# Patient Record
Sex: Female | Born: 1950 | Race: White | Hispanic: No | Marital: Married
Health system: Southern US, Community
[De-identification: ages and names within clinical notes are randomized; demographics above are authoritative.]

---

## 2007-04-08 ENCOUNTER — Ambulatory Visit: Payer: Self-pay | Admitting: Emergency Medicine

## 2007-07-19 ENCOUNTER — Ambulatory Visit: Payer: Self-pay | Admitting: Internal Medicine

## 2008-06-25 ENCOUNTER — Ambulatory Visit: Payer: Self-pay | Admitting: Internal Medicine

## 2009-12-23 ENCOUNTER — Ambulatory Visit: Payer: Self-pay | Admitting: Internal Medicine

## 2013-04-11 ENCOUNTER — Ambulatory Visit: Payer: Self-pay | Admitting: Emergency Medicine

## 2013-04-11 ENCOUNTER — Inpatient Hospital Stay: Payer: Self-pay | Admitting: Internal Medicine

## 2013-04-11 LAB — COMPREHENSIVE METABOLIC PANEL
Albumin: 4.2 g/dL (ref 3.4–5.0)
Alkaline Phosphatase: 106 U/L (ref 50–136)
Anion Gap: 15 (ref 7–16)
Calcium, Total: 9.2 mg/dL (ref 8.5–10.1)
Co2: 24 mmol/L (ref 21–32)
Creatinine: 1.54 mg/dL — ABNORMAL HIGH (ref 0.60–1.30)
Osmolality: 244 (ref 275–301)
Potassium: 3.1 mmol/L — ABNORMAL LOW (ref 3.5–5.1)
Sodium: 122 mmol/L — ABNORMAL LOW (ref 136–145)
Total Protein: 7.9 g/dL (ref 6.4–8.2)

## 2013-04-11 LAB — PHOSPHORUS: Phosphorus: 3.8 mg/dL (ref 2.5–4.9)

## 2013-04-11 LAB — CBC
HCT: 41.1 % (ref 35.0–47.0)
MCH: 31.3 pg (ref 26.0–34.0)
MCHC: 34.6 g/dL (ref 32.0–36.0)
Platelet: 380 10*3/uL (ref 150–440)
RDW: 13.8 % (ref 11.5–14.5)
WBC: 16.2 10*3/uL — ABNORMAL HIGH (ref 3.6–11.0)

## 2013-04-11 LAB — CK TOTAL AND CKMB (NOT AT ARMC): CK, Total: 191 U/L (ref 21–215)

## 2013-04-11 LAB — URINALYSIS, COMPLETE
Glucose,UR: NEGATIVE mg/dL (ref 0–75)
Ph: 6 (ref 4.5–8.0)
RBC,UR: 1 /HPF (ref 0–5)

## 2013-04-11 LAB — MAGNESIUM: Magnesium: 1.4 mg/dL — ABNORMAL LOW

## 2013-04-11 LAB — ETHANOL
Ethanol %: <0.003 % (ref 0.000–0.080)
Ethanol: <3 mg/dL

## 2013-04-11 LAB — TROPONIN I: Troponin-I: <0.02 ng/mL

## 2013-04-12 LAB — COMPREHENSIVE METABOLIC PANEL
Albumin: 3.7 g/dL (ref 3.4–5.0)
Alkaline Phosphatase: 94 U/L (ref 50–136)
Anion Gap: 8 (ref 7–16)
BUN: 13 mg/dL (ref 7–18)
Bilirubin,Total: 0.4 mg/dL (ref 0.2–1.0)
Chloride: 89 mmol/L — ABNORMAL LOW (ref 98–107)
Creatinine: 1.39 mg/dL — ABNORMAL HIGH (ref 0.60–1.30)
Glucose: 112 mg/dL — ABNORMAL HIGH (ref 65–99)
Osmolality: 251 (ref 275–301)
SGOT(AST): 32 U/L (ref 15–37)
SGPT (ALT): 22 U/L (ref 12–78)
Total Protein: 7 g/dL (ref 6.4–8.2)

## 2013-04-12 LAB — CBC WITH DIFFERENTIAL/PLATELET
Basophil #: 0.1 10*3/uL (ref 0.0–0.1)
Basophil %: 0.7 %
Eosinophil #: 0.2 10*3/uL (ref 0.0–0.7)
HCT: 36.6 % (ref 35.0–47.0)
Lymphocyte %: 18.8 %
MCHC: 34.5 g/dL (ref 32.0–36.0)
MCV: 91 fL (ref 80–100)
Monocyte #: 1.2 x10 3/mm — ABNORMAL HIGH (ref 0.2–0.9)
Monocyte %: 11.2 %
Neutrophil %: 67.7 %
Platelet: 329 10*3/uL (ref 150–440)
RDW: 13.9 % (ref 11.5–14.5)

## 2013-04-12 LAB — LIPID PANEL
HDL Cholesterol: 80 mg/dL — ABNORMAL HIGH (ref 40–60)
VLDL Cholesterol, Calc: 29 mg/dL (ref 5–40)

## 2013-04-12 LAB — HEMOGLOBIN A1C: Hemoglobin A1C: 5.2 % (ref 4.2–6.3)

## 2013-04-13 LAB — BASIC METABOLIC PANEL
Anion Gap: 8 (ref 7–16)
BUN: 7 mg/dL (ref 7–18)
Calcium, Total: 9 mg/dL (ref 8.5–10.1)
Co2: 25 mmol/L (ref 21–32)
Glucose: 97 mg/dL (ref 65–99)
Osmolality: 257 (ref 275–301)
Potassium: 3.2 mmol/L — ABNORMAL LOW (ref 3.5–5.1)
Sodium: 129 mmol/L — ABNORMAL LOW (ref 136–145)

## 2013-04-14 LAB — BASIC METABOLIC PANEL
BUN: 6 mg/dL — ABNORMAL LOW (ref 7–18)
Chloride: 101 mmol/L (ref 98–107)
Co2: 25 mmol/L (ref 21–32)
Potassium: 3.7 mmol/L (ref 3.5–5.1)
Sodium: 133 mmol/L — ABNORMAL LOW (ref 136–145)

## 2013-10-30 ENCOUNTER — Emergency Department: Payer: Self-pay | Admitting: Emergency Medicine

## 2014-01-18 ENCOUNTER — Ambulatory Visit: Payer: Self-pay | Admitting: Neurology

## 2014-01-18 ENCOUNTER — Inpatient Hospital Stay: Payer: Self-pay | Admitting: Internal Medicine

## 2014-01-18 LAB — DRUG SCREEN, URINE
Amphetamines, Ur Screen: NEGATIVE (ref ?–1000)
Barbiturates, Ur Screen: NEGATIVE (ref ?–200)
Benzodiazepine, Ur Scrn: POSITIVE (ref ?–200)
Cannabinoid 50 Ng, Ur ~~LOC~~: NEGATIVE (ref ?–50)
Cocaine Metabolite,Ur ~~LOC~~: NEGATIVE (ref ?–300)
MDMA (ECSTASY) UR SCREEN: NEGATIVE (ref ?–500)
METHADONE, UR SCREEN: NEGATIVE (ref ?–300)
Opiate, Ur Screen: NEGATIVE (ref ?–300)
Phencyclidine (PCP) Ur S: NEGATIVE (ref ?–25)
Tricyclic, Ur Screen: NEGATIVE (ref ?–1000)

## 2014-01-18 LAB — BASIC METABOLIC PANEL
Anion Gap: 8 (ref 7–16)
BUN: 12 mg/dL (ref 7–18)
CALCIUM: 8.8 mg/dL (ref 8.5–10.1)
CHLORIDE: 93 mmol/L — AB (ref 98–107)
Co2: 26 mmol/L (ref 21–32)
Creatinine: 1.04 mg/dL (ref 0.60–1.30)
EGFR (African American): >60
EGFR (Non-African Amer.): 58 — ABNORMAL LOW
GLUCOSE: 66 mg/dL (ref 65–99)
OSMOLALITY: 253 (ref 275–301)
Potassium: 3.4 mmol/L — ABNORMAL LOW (ref 3.5–5.1)
Sodium: 127 mmol/L — ABNORMAL LOW (ref 136–145)

## 2014-01-18 LAB — COMPREHENSIVE METABOLIC PANEL
ALK PHOS: 94 U/L
AST: 33 U/L (ref 15–37)
Albumin: 4 g/dL (ref 3.4–5.0)
Anion Gap: 11 (ref 7–16)
BUN: 18 mg/dL (ref 7–18)
Bilirubin,Total: 0.4 mg/dL (ref 0.2–1.0)
CHLORIDE: 86 mmol/L — AB (ref 98–107)
Calcium, Total: 9.6 mg/dL (ref 8.5–10.1)
Co2: 24 mmol/L (ref 21–32)
Creatinine: 1.13 mg/dL (ref 0.60–1.30)
EGFR (African American): >60
GFR CALC NON AF AMER: 52 — AB
GLUCOSE: 123 mg/dL — AB (ref 65–99)
Osmolality: 247 (ref 275–301)
Potassium: 3.6 mmol/L (ref 3.5–5.1)
SGPT (ALT): 26 U/L (ref 12–78)
Sodium: 121 mmol/L — ABNORMAL LOW (ref 136–145)
TOTAL PROTEIN: 7.8 g/dL (ref 6.4–8.2)

## 2014-01-18 LAB — TSH: Thyroid Stimulating Horm: 2.59 u[IU]/mL

## 2014-01-18 LAB — ACETAMINOPHEN LEVEL: Acetaminophen: <2 ug/mL

## 2014-01-18 LAB — CBC
HCT: 39.9 % (ref 35.0–47.0)
HGB: 13.6 g/dL (ref 12.0–16.0)
MCH: 30.8 pg (ref 26.0–34.0)
MCHC: 34.2 g/dL (ref 32.0–36.0)
MCV: 90 fL (ref 80–100)
Platelet: 326 10*3/uL (ref 150–440)
RBC: 4.43 10*6/uL (ref 3.80–5.20)
RDW: 12.6 % (ref 11.5–14.5)
WBC: 9.1 10*3/uL (ref 3.6–11.0)

## 2014-01-18 LAB — URINALYSIS, COMPLETE
BACTERIA: NONE SEEN
BILIRUBIN, UR: NEGATIVE
Blood: NEGATIVE
Glucose,UR: NEGATIVE mg/dL (ref 0–75)
Hyaline Cast: 3
LEUKOCYTE ESTERASE: NEGATIVE
Nitrite: NEGATIVE
PH: 7 (ref 4.5–8.0)
Protein: 100
SPECIFIC GRAVITY: 1.012 (ref 1.003–1.030)
Squamous Epithelial: NONE SEEN
WBC UR: <1 /HPF (ref 0–5)

## 2014-01-18 LAB — ETHANOL
Ethanol %: <0.003 % (ref 0.000–0.080)
Ethanol: <3 mg/dL

## 2014-01-18 LAB — SALICYLATE LEVEL: Salicylates, Serum: 2.8 mg/dL

## 2014-01-18 LAB — SODIUM
SODIUM: 124 mmol/L — AB (ref 136–145)
Sodium: 124 mmol/L — ABNORMAL LOW (ref 136–145)
Sodium: 128 mmol/L — ABNORMAL LOW (ref 136–145)

## 2014-01-18 LAB — URIC ACID: Uric Acid: 6.6 mg/dL — ABNORMAL HIGH (ref 2.6–6.0)

## 2014-01-18 LAB — OSMOLALITY: Osmolality: 258 mOsm/kg — ABNORMAL LOW (ref 280–301)

## 2014-01-19 LAB — CBC WITH DIFFERENTIAL/PLATELET
BASOS PCT: 0.3 %
Basophil #: 0 10*3/uL (ref 0.0–0.1)
EOS ABS: 0.4 10*3/uL (ref 0.0–0.7)
Eosinophil %: 5.4 %
HCT: 38.6 % (ref 35.0–47.0)
HGB: 12.6 g/dL (ref 12.0–16.0)
LYMPHS ABS: 2 10*3/uL (ref 1.0–3.6)
Lymphocyte %: 29.6 %
MCH: 29.8 pg (ref 26.0–34.0)
MCHC: 32.7 g/dL (ref 32.0–36.0)
MCV: 91 fL (ref 80–100)
Monocyte #: 0.9 x10 3/mm (ref 0.2–0.9)
Monocyte %: 12.9 %
NEUTROS PCT: 51.8 %
Neutrophil #: 3.5 10*3/uL (ref 1.4–6.5)
Platelet: 301 10*3/uL (ref 150–440)
RBC: 4.24 10*6/uL (ref 3.80–5.20)
RDW: 13.1 % (ref 11.5–14.5)
WBC: 6.9 10*3/uL (ref 3.6–11.0)

## 2014-01-19 LAB — BASIC METABOLIC PANEL
ANION GAP: 12 (ref 7–16)
BUN: 11 mg/dL (ref 7–18)
Calcium, Total: 8.9 mg/dL (ref 8.5–10.1)
Chloride: 94 mmol/L — ABNORMAL LOW (ref 98–107)
Co2: 22 mmol/L (ref 21–32)
Creatinine: 0.89 mg/dL (ref 0.60–1.30)
EGFR (Non-African Amer.): >60
GLUCOSE: 67 mg/dL (ref 65–99)
OSMOLALITY: 255 (ref 275–301)
Potassium: 3.1 mmol/L — ABNORMAL LOW (ref 3.5–5.1)
Sodium: 128 mmol/L — ABNORMAL LOW (ref 136–145)

## 2014-01-19 LAB — LIPID PANEL
Cholesterol: 140 mg/dL (ref 0–200)
HDL Cholesterol: 91 mg/dL — ABNORMAL HIGH (ref 40–60)
LDL CHOLESTEROL, CALC: 30 mg/dL (ref 0–100)
Triglycerides: 93 mg/dL (ref 0–200)
VLDL CHOLESTEROL, CALC: 19 mg/dL (ref 5–40)

## 2014-01-19 LAB — SODIUM
Sodium: 128 mmol/L — ABNORMAL LOW (ref 136–145)
Sodium: 128 mmol/L — ABNORMAL LOW (ref 136–145)
Sodium: 131 mmol/L — ABNORMAL LOW (ref 136–145)
Sodium: 133 mmol/L — ABNORMAL LOW (ref 136–145)
Sodium: 132 mmol/L — ABNORMAL LOW (ref 136–145)

## 2014-01-19 LAB — MAGNESIUM: Magnesium: 1.6 mg/dL — ABNORMAL LOW

## 2014-01-19 LAB — TSH: THYROID STIMULATING HORM: 2.64 u[IU]/mL

## 2014-01-20 LAB — SODIUM
Sodium: 133 mmol/L — ABNORMAL LOW (ref 136–145)
Sodium: 133 mmol/L — ABNORMAL LOW (ref 136–145)
Sodium: 135 mmol/L — ABNORMAL LOW (ref 136–145)

## 2014-01-20 LAB — POTASSIUM: POTASSIUM: 4 mmol/L (ref 3.5–5.1)

## 2014-01-20 LAB — MAGNESIUM: Magnesium: 1.9 mg/dL

## 2014-01-21 ENCOUNTER — Observation Stay: Payer: Self-pay | Admitting: Internal Medicine

## 2014-01-21 LAB — CBC WITH DIFFERENTIAL/PLATELET
BASOS PCT: 0.8 %
Basophil #: 0.1 10*3/uL (ref 0.0–0.1)
EOS ABS: 0.3 10*3/uL (ref 0.0–0.7)
EOS PCT: 2.8 %
HCT: 39.2 % (ref 35.0–47.0)
HGB: 12.9 g/dL (ref 12.0–16.0)
Lymphocyte #: 1.6 10*3/uL (ref 1.0–3.6)
Lymphocyte %: 16 %
MCH: 30.4 pg (ref 26.0–34.0)
MCHC: 33 g/dL (ref 32.0–36.0)
MCV: 92 fL (ref 80–100)
MONOS PCT: 9.9 %
Monocyte #: 1 x10 3/mm — ABNORMAL HIGH (ref 0.2–0.9)
NEUTROS ABS: 6.9 10*3/uL — AB (ref 1.4–6.5)
Neutrophil %: 70.5 %
PLATELETS: 335 10*3/uL (ref 150–440)
RBC: 4.24 10*6/uL (ref 3.80–5.20)
RDW: 13 % (ref 11.5–14.5)
WBC: 9.8 10*3/uL (ref 3.6–11.0)

## 2014-01-21 LAB — BASIC METABOLIC PANEL
ANION GAP: 6 — AB (ref 7–16)
BUN: 10 mg/dL (ref 7–18)
CALCIUM: 9.8 mg/dL (ref 8.5–10.1)
CREATININE: 1.1 mg/dL (ref 0.60–1.30)
Chloride: 97 mmol/L — ABNORMAL LOW (ref 98–107)
Co2: 27 mmol/L (ref 21–32)
EGFR (African American): >60
GFR CALC NON AF AMER: 54 — AB
Glucose: 68 mg/dL (ref 65–99)
OSMOLALITY: 258 (ref 275–301)
Potassium: 4.1 mmol/L (ref 3.5–5.1)
Sodium: 130 mmol/L — ABNORMAL LOW (ref 136–145)

## 2014-01-21 LAB — DRUG SCREEN, URINE

## 2014-01-21 LAB — URINALYSIS, COMPLETE
BILIRUBIN, UR: NEGATIVE
Bacteria: NONE SEEN
Glucose,UR: NEGATIVE mg/dL (ref 0–75)
Leukocyte Esterase: NEGATIVE
Nitrite: NEGATIVE
PH: 7 (ref 4.5–8.0)
Specific Gravity: 1.009 (ref 1.003–1.030)
Squamous Epithelial: <1
WBC UR: 1 /HPF (ref 0–5)

## 2014-01-21 LAB — ETHANOL
Ethanol %: <0.003 % (ref 0.000–0.080)
Ethanol: <3 mg/dL

## 2014-01-21 LAB — HEPATIC FUNCTION PANEL A (ARMC)
ALBUMIN: 4.4 g/dL (ref 3.4–5.0)
Alkaline Phosphatase: 95 U/L
Bilirubin,Total: 0.4 mg/dL (ref 0.2–1.0)
SGOT(AST): 72 U/L — ABNORMAL HIGH (ref 15–37)
SGPT (ALT): 35 U/L (ref 12–78)
Total Protein: 7.5 g/dL (ref 6.4–8.2)

## 2014-01-21 LAB — AMMONIA: Ammonia, Plasma: 14 mcmol/L (ref 11–32)

## 2014-01-22 LAB — BASIC METABOLIC PANEL
Anion Gap: 9 (ref 7–16)
BUN: 7 mg/dL (ref 7–18)
CHLORIDE: 102 mmol/L (ref 98–107)
CO2: 23 mmol/L (ref 21–32)
Calcium, Total: 8.6 mg/dL (ref 8.5–10.1)
Creatinine: 0.93 mg/dL (ref 0.60–1.30)
EGFR (African American): >60
EGFR (Non-African Amer.): >60
Glucose: 58 mg/dL — ABNORMAL LOW (ref 65–99)
Osmolality: 264 (ref 275–301)
Potassium: 3.5 mmol/L (ref 3.5–5.1)
SODIUM: 134 mmol/L — AB (ref 136–145)

## 2014-01-22 LAB — CBC WITH DIFFERENTIAL/PLATELET
BASOS ABS: 0.1 10*3/uL (ref 0.0–0.1)
BASOS PCT: 1.4 %
EOS PCT: 6.4 %
Eosinophil #: 0.4 10*3/uL (ref 0.0–0.7)
HCT: 33.9 % — ABNORMAL LOW (ref 35.0–47.0)
HGB: 11.2 g/dL — ABNORMAL LOW (ref 12.0–16.0)
LYMPHS PCT: 36.5 %
Lymphocyte #: 2 10*3/uL (ref 1.0–3.6)
MCH: 30.4 pg (ref 26.0–34.0)
MCHC: 33.2 g/dL (ref 32.0–36.0)
MCV: 92 fL (ref 80–100)
Monocyte #: 0.7 x10 3/mm (ref 0.2–0.9)
Monocyte %: 13.3 %
NEUTROS ABS: 2.3 10*3/uL (ref 1.4–6.5)
Neutrophil %: 42.4 %
PLATELETS: 275 10*3/uL (ref 150–440)
RBC: 3.69 10*6/uL — ABNORMAL LOW (ref 3.80–5.20)
RDW: 13.3 % (ref 11.5–14.5)
WBC: 5.5 10*3/uL (ref 3.6–11.0)

## 2014-01-22 LAB — MAGNESIUM: MAGNESIUM: 1.2 mg/dL — AB

## 2014-08-27 ENCOUNTER — Emergency Department: Payer: Self-pay | Admitting: Student

## 2014-08-27 LAB — COMPREHENSIVE METABOLIC PANEL
ALT: 21 U/L
ANION GAP: 11 (ref 7–16)
Albumin: 2.6 g/dL — ABNORMAL LOW (ref 3.4–5.0)
Alkaline Phosphatase: 120 U/L — ABNORMAL HIGH
BILIRUBIN TOTAL: 0.4 mg/dL (ref 0.2–1.0)
BUN: 44 mg/dL — AB (ref 7–18)
CREATININE: 1.85 mg/dL — AB (ref 0.60–1.30)
Calcium, Total: 8.9 mg/dL (ref 8.5–10.1)
Chloride: 99 mmol/L (ref 98–107)
Co2: 24 mmol/L (ref 21–32)
GFR CALC AF AMER: 35 — AB
GFR CALC NON AF AMER: 29 — AB
GLUCOSE: 66 mg/dL (ref 65–99)
Osmolality: 278 (ref 275–301)
Potassium: 4.6 mmol/L (ref 3.5–5.1)
SGOT(AST): 30 U/L (ref 15–37)
Sodium: 134 mmol/L — ABNORMAL LOW (ref 136–145)
Total Protein: 7.8 g/dL (ref 6.4–8.2)

## 2014-08-27 LAB — PROTIME-INR
INR: 1.2
PROTHROMBIN TIME: 14.9 s — AB (ref 11.5–14.7)

## 2014-08-27 LAB — CBC
HCT: 31.2 % — AB (ref 35.0–47.0)
HGB: 10 g/dL — ABNORMAL LOW (ref 12.0–16.0)
MCH: 29.4 pg (ref 26.0–34.0)
MCHC: 31.9 g/dL — AB (ref 32.0–36.0)
MCV: 92 fL (ref 80–100)
Platelet: 530 10*3/uL — ABNORMAL HIGH (ref 150–440)
RBC: 3.39 10*6/uL — ABNORMAL LOW (ref 3.80–5.20)
RDW: 14.5 % (ref 11.5–14.5)
WBC: 10.7 10*3/uL (ref 3.6–11.0)

## 2014-08-27 LAB — TROPONIN I

## 2014-08-27 LAB — APTT: Activated PTT: 33.1 secs (ref 23.6–35.9)

## 2014-08-27 LAB — PRO B NATRIURETIC PEPTIDE: B-Type Natriuretic Peptide: 9778 pg/mL — ABNORMAL HIGH (ref 0–125)

## 2014-08-28 ENCOUNTER — Ambulatory Visit (HOSPITAL_COMMUNITY)
Admission: AD | Admit: 2014-08-28 | Discharge: 2014-08-28 | Disposition: A | Discharge status: Home / Self Care | Payer: BC Managed Care – PPO | Source: Other Acute Inpatient Hospital | Attending: Student | Admitting: Student

## 2014-08-28 DIAGNOSIS — J449 Chronic obstructive pulmonary disease, unspecified: Secondary | ICD-10-CM | POA: Diagnosis present

## 2014-08-28 DIAGNOSIS — I509 Heart failure, unspecified: Secondary | ICD-10-CM | POA: Insufficient documentation

## 2014-11-12 ENCOUNTER — Inpatient Hospital Stay: Payer: Self-pay | Admitting: Internal Medicine

## 2014-11-17 LAB — PLATELET COUNT: Platelet: 503 10*3/uL — ABNORMAL HIGH (ref 150–440)

## 2014-11-25 DEATH — deceased

## 2014-12-16 NOTE — Discharge Summary (Signed)
PATIENT NAME:  Sherry Lawrence, Sherry Lawrence MR#:  [id number] DATE OF BIRTH:  06/05/1951  DATE OF ADMISSION:  04/11/2013 DATE OF DISCHARGE:  04/14/2013  ADMITTING DIAGNOSIS: Syncope, status post fall.   DISCHARGE DIAGNOSES: 1. Syncope, due to hypotension as well as severe electrolyte abnormalities, status post intravenous fluids and electrolyte replacement.  2.  Hyponatremia, again due to volume depletion and hydrochlorothiazide therapy.  3.  Hypokalemia, status post replacement.  4.  Mild elevated LFT, possibly due to alcohol.  5.  Leukocytosis, felt to be stress reaction.  6.  Right middle lobe calcification likely secondary to previous scarring.  7.  Maxillary sinus fracture as well as nasal fracture, status post evaluation by ENT.  8.  Fracture of the maxilla from dental implant being dislodged and coming loose. She is going to follow up with her oral surgeon, Dr. Landry Mellow, on Thursday of this week.  9.  History of alcohol use in the past.  10.  Acute renal failure due to dehydration, now resolved.   CONSULTANTS: Dr. Carloyn Manner.   PERTINENT LABS AND EVALUATIONS: Admitting glucose 61, BUN 15, creatinine 1.54, sodium 122, potassium 3.1, chloride was 83, CO2 was 24, magnesium was 1.4.   Alcohol level was less than 0.003. LFTs: Total protein 7.9, albumin 4.2, bili total 0.5, alk phos 106. AST is 40, ALT is 25. Troponin less than 0.02. CPK 191, CK-MB 7.8. WBC count 16.2, hemoglobin 14.2, platelet count is 380. Glucose 101, BUN 6, creatinine 0.97, sodium 133, potassium 3.7, chloride 101, CO2 is 25.   Maxillofacial CT shows fracture of the anterior maxilla, soft tissue swelling noted in the level of the chin, with underlying septal foreign body septal fractures, nasal bone, age-undetermined.   CT cervical spine: Postsurgical changes of C2 for odontoid fracture; diffuse degenerative changes.    CT of the head shows no atrophy, no acute intracranial abnormalities.   Chest x-ray of the right rib shows  fourth, and possibly third, rib fractures anterolaterally.   HOSPITAL COURSE: Please refer to H and P done by the admitting physician.   The patient is a 64 year old white female who has been severe alcoholic for many years, who quit drinking about 1-1/2 months ago. She went into DTs. With some detox medication was doing better. The patient was in her normal state of health prior.   She was found on the floor by her son before going to church. The patient was very dizzy and lightheaded. Then after being found on the floor she was feeling okay so they went out to eat at [facility] [employer] [facility], and she got out of the car, took 3 steps, then fainted and passed out. She was seen in the ED and had an impacted tooth. She also had nasal and maxillary sinus fractures. She was found to be severely dehydrated, with multiple electrolyte abnormalities. The patient reported that she was on a grapefruit diet.   Due to her symptoms we were asked to admit the patient, and the patient was aggressively hydrated. Electrolytes all replaced. In terms of her maxillofacial fracture she was seen by  Dr. Pryor Ochoa, who recommended empiric antibiotics for her as well as a followup with her oral surgeon, Dr. Landry Mellow for the fracture of the maxilla from dental implants of the right incisor. The patient was scheduled to be seen by him this Thursday. At this point she is doing much better. Electrolytes are improved. The patient is able to ambulate without assistance, so she is stable for discharge. I recommended her  to continue drinking and eating properly.   DISCHARGE MEDICATIONS: ProAir 1 puff 4 times a day as needed, atorvastatin 40 daily, budesonide/formoterol 2 puffs b.i.d., clonazepam 0.5 mg 1 tablet p.o. at bedtime, Vagifem  10 mcg vaginal tablets 2 times a week, ipratropium nasal 2 sprays to each nostril b.i.d. as needed, albuterol/Atrovent nebulizers 4 times a day, lisinopril 20 daily, venlafaxine 75, 2 capsules daily, Ambien 5 at bedtime  p.r.n., sleep; acetaminophen and oxycodone 325/5 q.4 p.r.n., Augmentin 875 mg 1 tablet p.o. b.i.d. x 5 days.   DIET: Low sodium.   ACTIVITY: As tolerated.   FOLLOWUP: Primary M.D. in 1 to 2 weeks. Follow up with Dr. Landry Mellow, oral maxillofacial surgeon.  Time spent: Thirty-five minutes spent on this discharge.    ____________________________ Lafonda Mosses. Posey Pronto, MD shp:dm D: 04/15/2013 71:25:24 ET T: 04/15/2013 08:36:43 ET JOB#: [id number]  cc: Joycelyn Liska H. Posey Pronto, MD, <Dictator> Alric Seton MD ELECTRONICALLY SIGNED 04/20/2013 18:20
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
## 2014-12-17 NOTE — Discharge Summary (Signed)
PATIENT NAME:  Sherry Lawrence, Sherry Lawrence MR#:  [id number] DATE OF BIRTH:  1950-10-06  DATE OF ADMISSION:  01/18/2014 DATE OF DISCHARGE:  01/20/2014  PRIMARY CARE PHYSICIAN: Nonlocal.  CONSULTING PHYSICIANS: Dr. Loretha BrasilZeylikman, Dr. Cherylann RatelLateef, and Dr. Toni Amendlapacs.  DISCHARGE DIAGNOSES:  1.  Altered mental status and agitation possibly due to seizure secondary to hyponatremia.  2.  Chronic obstructive pulmonary disease.  3.  Alcohol abuse.  4.  Hypertension.  5.  Alcohol withdrawal associated with cognitive changes, Korsakoff'Lawrence dementia.   CONDITION: Stable.   CODE STATUS: FULL code.   HOME MEDICATIONS: Please refer to the medication reconciliation list.   DISCHARGE DIET: Regular diet.   DISCHARGE ACTIVITY: As tolerated.   FOLLOW-UP CARE: Follow up with PCP within 1 to 2 weeks. Follow up with Dr. Toni Amendlapacs within 1 to 2 weeks. The patient needs detox, smoking cessation and alcohol detox.   REASON FOR ADMISSION: Altered mental status and seizure.   HOSPITAL COURSE: The patient is a 64 year old Caucasian female with a history of depression, anxiety, hypertension, COPD, and alcohol dependence. Was sent to the ED due to altered mental status and seizure activity, about 4 minutes. The patient drank a large quantity of beer 2 days prior to this admission. The patient was confused. Only knew her name but not to time and place. The patient was noted to have a low sodium at 121 and admitted for altered mental status, fever, and hyponatremia. For detailed history and physical examination, please refer to the admission note dictated by Dr. Amado CoeGouru. On admission date, the patient'Lawrence sodium was 121, BUN 18, creatinine 1.13, potassium 3.6, and bicarb 24. CBC was normal.  1.  Altered mental status, possibly due to seizure and hyponatremia. After admission the patient has been treated with normal saline IV. Sodium level has been improving. The last sodium is 135 this morning. The patient'Lawrence mental status has much improved. She is  alert, awake, and oriented x3. She has no complaints. No seizure activity or tremor/shaking.  2.  Seizure activity, which is possibly due to hyponatremia. Dr. Loretha BrasilZeylikman evaluated the patient and suggested no EEG, but continue normal saline.  3.  For alcohol abuse, the patient has been treated with CIWA protocol. Dr. 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 The patient needs detox as outpatient.  4.  COPD, stable. 5.  Hypertension, controlled.  The patient has much improved. No complaints. Vital signs are stable. Physical examination is unremarkable. She is clinically stable and will be discharged to home today. I discussed the patient'Lawrence discharge plan with the patient, the [name], nurse, and case Production designer, theatre/television/filmmanager.   TIME SPENT: About 37 minutes.  ____________________________ Shaune PollackQing Emitt Maglione, MD qc:sb D: 01/20/2014 12:13:18 ET T: 01/20/2014 12:34:21 ET JOB#: [id number]  cc: Shaune PollackQing Basel Defalco, MD, <Dictator> Shaune PollackQING Gill Delrossi MD ELECTRONICALLY SIGNED 01/20/2014 15:21
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
## 2014-12-17 NOTE — Consult Note (Signed)
PATIENT NAME:  Sherry Lawrence, Sherry Lawrence MR#:  [id number] DATE OF BIRTH:  Apr 18, 1951  DATE OF CONSULTATION:  01/18/2014  REFERRING PHYSICIAN:  Dr. Ramonita LabAruna Lawrence CONSULTING PHYSICIAN:  Sherry Stracener Lizabeth LeydenN. Branon Sabine, MD  REASON FOR CONSULTATION: Acute hyponatremia in the setting of seizure episode.   HISTORY OF PRESENT ILLNESS: The patient is a 64 year old Caucasian female with past medical history of hypertension, chronic obstructive pulmonary disease, hyperlipidemia, depression, anxiety, and alcohol abuse who presented to [facility] with altered mental status. The patient is unable to offer accurate history at this point in time. The [name] is currently at the bedside and provides most of the history. The [name] reports that he noticed confusion in his wife, which is a new finding yesterday at approximately 4:00 p.m. He reports that she had been abstaining from alcohol for the past seven months; however, recently restarted alcohol. He noticed that she drank at least 2 beers yesterday. Her p.o. intake has been rather poor as well. Later in the afternoon he noticed seizure episode and noticed tonic-clonic extremity activity, as well as foaming at the mouth. Subsequently EMS was called. Once she was brought here she was found to have metabolic derangements. Serum sodium was quite low at 121. Serum sodium 04/14/2013, was 133. Therefore, it appears that she had acute hyponatremia. Serum alcohol level was low upon initial evaluation. The patient has been started on IV fluid hydration with 0.9% normal saline at 30 mL an hour and is also receiving sodium chloride fortified with multivitamin, thiamine, folic acid and magnesium.   PAST MEDICAL HISTORY: 1. Hypertension.  2. Chronic obstructive pulmonary disease.  3. Hyperlipidemia.  4. Depression.  5. Anxiety.  6. Alcohol abuse.  7. Left forearm fracture  8. History of ruptured spleen.  9. Subdural hematoma   ALLERGIES: No known drug  allergies.   CURRENT INPATIENT MEDICATIONS: Include: 1. Sodium chloride fortified with multivitamin, thiamine, folic acid, magnesium to be run at 40 mL/h.  2. Normal saline 0.9% at 30 mL/h.  3. Norco 5/325 mg 1 to 2 tablets p.o. q.4h. p.r.n.  4. Lipitor 40 mg p.o. daily.  5. Klonopin 0.5 mg p.o. at bedtime.  6. Haldol 2 mg IV q.4 hours p.r.n.  7. Heparin 5000 units subcutaneous q.8 hours.  8. Ativan 2 mg IV q.1 hour p.r.n.  9. Nitroglycerin 2% ointment 1 inch topical q.6 hours p.r.n.  10. Zofran 4 mg p.o. every six hours p.r.n.  11. Zantac 150 mg p.o. every 12 hours.  12. Effexor 150 mg p.o. daily.   SOCIAL HISTORY: The patient lives in [place]. She is married. She has a history of tobacco abuse and smokes at least 1 pack of cigarettes per day. She has history of alcohol abuse in the past. She had been abstaining for seven months; however, it appears that recently had alcohol relapse. No illicit drug use reported.   FAMILY HISTORY: Mother deceased secondary to pancreatic cancer. [name] is also deceased; however, the patient unaware of how he died.   REVIEW OF SYSTEMS: Unable to obtain from the patient as she is currently confused.   PHYSICAL EXAMINATION: VITAL SIGNS: Temperature 98.3, pulse 90, respirations 27, blood pressure 130/61, pulse oximetry 95%.  GENERAL: Reveals well-developed, well-nourished Caucasian female who appears her stated age, currently in no acute distress.  HEENT: Normocephalic, atraumatic. Extraocular movements are intact. Pupils equal, round, and reactive to light. No scleral icterus. Conjunctivae are pink. No epistaxis noted. Gross hearing intact. Oral mucosa moist.  NECK: Supple without JVD or lymphadenopathy.  LUNGS: Clear to auscultation bilaterally with normal respiratory effort.  HEART: S1, S2 regular rate and rhythm. No murmurs, rubs, or gallops appreciated.  ABDOMEN: Soft, nontender, nondistended. Bowel sounds positive. No rebound or guarding.  No gross organomegaly appreciated.  EXTREMITIES: No clubbing, cyanosis, or edema.   NEUROLOGIC: The patient is currently awake and alert. Strength is 5/5 in both upper and lower extremities.  SKIN: Warm and dry. No rashes noted.  MUSCULOSKELETAL: No joint redness, swelling or tenderness appreciated.  PSYCHIATRIC: The patient is confused at this time. She is oriented to self and place. Limited insight into her current illness.  GENITOURINARY: No suprapubic tenderness is noted at this time.   LABORATORY DATA: Sodium 121, potassium 3.6, chloride 86, CO2 24, BUN 18, creatinine 1.13, glucose 113, ethanol less than 3, total protein 7.8, albumin 4.0, total bilirubin 0.4, alkaline phosphatase 94, AST 33, ALT 26. Urine drug screen is positive for benzodiazepines and otherwise negative. CBC shows WBC 9.1, hemoglobin 13.6, hematocrit 39, platelets 326, urine protein 100 mg/dL, acetaminophen level less than 2, salicylates 2.8.   IMPRESSION: This is a 64 year old Caucasian female with past medical history of hypertension, chronic obstructive pulmonary disease, hyperlipidemia, depression, anxiety, alcohol dependence, who presented to [facility] with altered mental status and seizure episode.  PROBLEM LIST: 1. Acute hyponatremia.  2. Seizure episode.  3. Altered mental status.  4. Hypertension.  5. Alcohol abuse.   PLAN: The patient presented with altered mental status and seizure episode. Differential diagnosis is extensive but the hyponatremia may be playing a role as well as recent alcohol consumption and the possibility of withdrawal. Serum sodium was noted to be quite low at 121. We agree with the current IV fluid hydration strategy. We would recommend close monitoring of serum sodium every four hours. Goal serum sodium within the next 24 hours would be 130 mEq/L. Agree with holding hydrochlorothiazide at this point in time as this could be potentially contributing to the  hyponatremia. We also agree with watching for delirium tremens closely and using Ativan as needed. We will check a TSH, urine sodium, and uric acid as well. I would like to thank Dr. Amado Coe for this kind referral. Further plan as the patient progresses .  ____________________________ Lennox Pippins, MD mnl:sg D: 01/18/2014 09:38:59 ET T: 01/18/2014 10:20:56 ET JOB#: [id number]  cc: Lennox Pippins, MD, <Dictator> Lennox Pippins MD ELECTRONICALLY SIGNED 01/20/2014 9:43

## 2014-12-17 NOTE — Consult Note (Signed)
Brief Consult Note: Diagnosis: alcohol withdrawl with associated cognitive changes.   Patient was seen by consultant.   Consult note dictated.   Recommend further assessment or treatment.   Comments: Psychiatry: PAtient seen, chart reviewed, consult dictated. From a detox standpoint she seems to be doing well. Not delirious and hemodynamically stable. I think she has a modest degree of Korsakoff's dementia. I'm interested whether husband thinks she is back to baseline. Will follow.  Electronic Signatures: Audery Amellapacs, John T (MD)  (Signed 27-May-15 23:12)  Authored: Brief Consult Note   Last Updated: 27-May-15 23:12 by Audery Amellapacs, John T (MD)

## 2014-12-17 NOTE — Discharge Summary (Signed)
PATIENT NAME:  Sherry Lawrence, Sherry Lawrence MR#:  [id number] DATE OF BIRTH:  12-Jan-1951  DATE OF ADMISSION:  01/21/2014  DATE OF DISCHARGE:  01/23/2014  DISCHARGE DIAGNOSES: 1.  Alcohol-induced delirium.  2.  History of alcohol-induced Korsakoff dementia. 3.  Chronic obstructive pulmonary disease.   4.  Hypertension.  5.  Anxiety and depression.  6.  Alcohol dependency.  7.  History of alcohol-induced seizures.  8.  Leg cellulitis.  DISCHARGE MEDICATIONS: 1.  Klonopin 0.5 mg at bedtime, 1 tablet as needed for anxiety.  2.  Budesonide with formoterol 160/4.5 inhalation 2 puffs b.i.d.  3.  Atorvastatin 40 mg p.o. daily.  4.  DuoNeb 1 vial via nebulizer 4 times daily.  5.  Lisinopril 20 mg p.o. daily.  6.  Venlafaxine 75 mg 2 capsules daily.  7.  Folic acid 1 mg p.o. daily.  8.  Thiamine 100 mg p.o. daily, 3 tablets. 9.  Clindamycin 300 mg every 8 hours for 5 days.   CONSULTATIONS: Psychiatric consult with Dr. Guss Bunde.    HOSPITAL COURSE:  108.  A 64 year old female patient who was discharged on the 28th, came back on the 29th because of confusion. The patient's husband brought her because of confusion, and patient was talking to herself.  The patient was admitted because of altered mental status secondary to possible delirium due to alcohol abuse. The patient really was confused, unable to remember her husband, and she was seen in the Emergency Room and admitted to medicine. The patient was here the day before, and she had Korsakoff dementia, hyponatremia and possible seizure due to hyponatremia. She received IV thiamine, and patient was discharged to home. The patient's lab work was unremarkable this time.  The patient's CT head did not show any acute changes. Chest x-ray was normal. So patient was started on IV thiamine along with multivitamins, continued on CIWA protocol. The patient received IV thiamine 500 mg t.i.d. IV, and she received it for 2 days, on the 29th and 30th. She was more alert, awake,  oriented yesterday. We continued thiamine yesterday and today. Today, she is denying any complaints, and she did not have any withdrawal symptoms, so we are discharging her home with thiamine 100 mg p.o. daily. She was seen by Dr. Loretha Brasil  on the last discharge. The patient's B12 levels were sent, but results are pending, and patient also is on folic acid 1 mg daily because of alcohol-induced Korsakoff dementia. She also was seen by Dr. Guss Bunde this time and Dr. Toni Amend last time. She needs to be on thiamine and folic acid on a regular basis.   2.  COPD. The patient does not have any wheezing. Continue her home medications.   3.  Hypertension, controlled.   4. Cellulitis because of staying on her knees for a long time at home. Started on clindamycin, continue clindamycin. The patient has no fever and cellulitis is improving. Vital signs this morning, temperature 97.9, heart rate 90, blood pressure 176/80, O2 sats 100% on room air. The patient also found to have hypomagnesemia, which is being replaced.   5.  For depression, she is on venlafaxine and also Klonopin.   6.  For hypertension, she is on lisinopril 20 mg p.o. daily.   Will ask social worker to help with referring her to substance abuse and detox programs. Discussed this with husband.   Time spent on discharge preparation: More than 30 minutes.    ____________________________ Katha Hamming, MD sk:mr D: 01/23/2014 08:50:00 ET T: 01/23/2014  16:06:45 ET JOB#: [id number]  cc: Katha HammingSnehalatha Teagen Mcleary, MD, <Dictator> Katha HammingSNEHALATHA Savilla Turbyfill MD ELECTRONICALLY SIGNED 01/24/2014 7:01
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
## 2014-12-17 NOTE — Consult Note (Signed)
PATIENT NAME:  Sherry Lawrence, Sherry Lawrence MR#:  [id number] DATE OF BIRTH:  02-03-1951  DATE OF CONSULTATION:  01/19/2014  REFERRING PHYSICIAN:   CONSULTING PHYSICIAN:  Audery AmelJohn T. Skylen Danielsen, MD  IDENTIFYING INFORMATION AND REASON FOR CONSULTATION:  A 64 year old woman with a history of alcohol dependence who was admitted to the hospital for altered mental status and a recent seizure.  Consult for "detox."   HISTORY OF PRESENT ILLNESS:  Information obtained from the patient and the chart.  She was brought to the hospital on the 26th by her husband who reported that she had had an observed seizure.  This followed after her having a lot of beer to drink on Sunday which came after a period of two week sobriety.  Here in the hospital, she was initially confused with extreme hyponatremia.  She was briefly in the Intensive Care Unit and was described as having bizarre behavior.  The patient herself minimizes her drinking.  She says that a couple days ago she did have two beers.  She says that prior to that she had not had any alcohol since the middle of April when she discontinued her usual habit of consuming large amounts of whiskey on a regular basis.  There is no history of any other recent psychiatric concerns.   PAST PSYCHIATRIC HISTORY:  Long-standing history of alcohol dependence.  She also has had a history of depression although details are lacking.  No known history of suicide attempts or psychiatric hospitalizations.   PAST MEDICAL HISTORY:  The patient has COPD, a history of pain problems and a history of several orthopedic injuries, many of which seem to have been related to drinking.  Most recently, she has a fracture in her left forearm which is still healing.   SOCIAL HISTORY:  Lives with her husband.  She indicates to me feeling contented with her home life.  It sounds like they have an adult son who either lives nearby or with them or visits frequently.   REVIEW OF SYSTEMS:  She is currently denying any  pain.  Denies any depression.  Denies suicidal or homicidal ideation.  Denies any sense of being confused or forgetful.  Full review of systems is negative.   MENTAL STATUS EXAMINATION:  This is a 64 year old woman who was wide awake at 10:30 when I came to see her.  She is wearing makeup in a manner a bit suggestive of someone not completely in control of their faculties.  She is polite and cooperative during the interview.  Eye contact is good.  Psychomotor activity normal.  Speech is normal in tone and rate.  Affect smiling and generally appropriate.  Mood is stated as good.  Denies suicidal or homicidal ideation.  Her thought process is notable for rambling and extreme tangentiality.  A single simple question asked of her generated long stories that flew off into tangents, very far from the central question and required constant redirection.  She was able to remember one out of three objects at five minutes.  She was oriented to the fact that she was in the hospital, although she initially got the name of the hospital wrong.  She was mistaken about the date.  I tried to do a little bit of cognitive testing with her and asked her to write a sentence for me on a piece of paper.  The patient proceeded to write a paragraph that filled the entire sheet of paper and would have continued going if I had not interrupted her.  Judgment and insight a bit impaired.  Fund of knowledge seems to probably be normal.   LABORATORY RESULTS:  Most recently, her sodium is coming back to normal.  It is 132 today.  Potassium and chloride also normalizing.   VITAL SIGNS:  Blood pressure is currently 111/71, pulse 97, temperature 98.3.   MEDICATION:  It appears that the usual alcohol detox protocol was ordered involving as needed medicine.  It looks like she has gotten only a couple of doses of as needed Ativan, not very frequently.   ASSESSMENT:  This is a 64 year old woman with long-standing alcohol dependence.  Recent  seizure, probably is at least in part related to her relapse into alcohol.  The question was about detox.  From the standpoint of alcohol detox she appears to be doing quite well.  She has stable hemodynamic findings and she is not currently delirious.  She is doing well without needing any extra doses of benzodiazepines.  I would not suggest any change to the current protocol from that standpoint.  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  Nevertheless, she appears to be emotionally content.  No sign of depression or psychosis.  I would be interested to know if her current mental state seems like baseline to her husband.   TREATMENT PLAN:  No change to current treatment plan.  Thiamine replenishment has already been done.  Current detox protocol is fine.  The patient is talking about wanting to go to a long-term substance abuse program.  Based on her current mental state I am not sure if such a thing would be helpful, so much as would be getting her into an environment where she cannot get her hands on any alcohol.  Nevertheless, I will continue to follow up.   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   SECONDARY DIAGNOSES: AXIS I:  Alcohol dependence.  AXIS II:  No diagnosis.  AXIS III:  Alcohol withdrawal.  Recent seizure.  AXIS IV:  Moderate from chronic illness.  AXIS V:  Functioning at time of evaluation 40.    ____________________________ Audery Amel, MD jtc:ea D: 01/19/2014 23:24:00 ET T: 01/20/2014 01:40:18 ET JOB#: [id number]  cc: Audery Amel, MD, <Dictator> Audery Amel MD ELECTRONICALLY SIGNED 02/09/2014 13:19
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
## 2014-12-25 NOTE — Consult Note (Signed)
PATIENT NAME:  Sherry Lawrence, Sherry Lawrence MR#:  [id number] DATE OF BIRTH:  Jan 09, 1951  DATE OF CONSULTATION:  11/12/2014  REFERRING PHYSICIAN:   CONSULTING PHYSICIAN:  Scot Jun, MD  HISTORY OF PRESENT ILLNESS: The patient is a 64 year old white female who went to the ER for acute confusion, abdominal pain, and constipation. She was admitted to the hospital and I was asked to see her in consultation.   PAST MEDICAL HISTORY: 1.  Recent C-spine fusion, wearing a collar.  2.  History of seizure disorder in the past, sometimes alcohol withdrawal seizures.  3.  COPD. A pack a day smoker for all her adult life.  4.  Anxiety, depression.  5.  Hypertension.   MEDICATIONS: Venlafaxine 75 mg 3 capsules once a day, Tylenol 325 mg one 3 times a day, thiamine 100 mg a day, ProAir inhaler 4 times a day, oxycodone 5 mg 1 to 2 tabs q. 4 hours, transdermal nicotine patch 14 mg in 24 hours, Relafen 500 mg one 2 times a day, extended morphine 1 q. 12 hours, methocarbamol 500 mg one 3 times a day, lisinopril 20 mg one a day, Atrovent nasal twice a day, hydrochlorothiazide 0.5 tablets once a day, folic acid 1 mg a day, Pepcid 10 mg a day, docusate 2 tablets 2 times a day which she was not taking according to her husband, clonazepam 0.5 mg one a day, atorvastatin 40 mg a day, amlodipine 5 mg a day, Combivent inhaler or DuoNebs.   HABITS: Smoked a pack a day. Has a history of alcohol use and dependence for many years, been off alcohol since 2013. She did quit smoking in late 2015 as well.   FAMILY HISTORY: See old records.   PHYSICAL EXAMINATION: GENERAL: Exam done with husband and nurse in the room. Temperature 98.6, pulse 112, respirations 18, blood pressure 85/44, 96% on 2 liters.  GENERAL: Elderly white female who looks older than stated age.  HEENT: Sclerae nonicteric. Conjunctivae negative. Tongue is negative.  HEAD: Atraumatic. She is wearing a cervical collar.  CHEST: Clear in the anterior fields.  HEART:  Shows a 1/6 systolic murmur.  ABDOMEN: Bowel sounds are present but diminished. No hepatosplenomegaly. No masses. No bruits.  RECTAL: Exam showed no stool in the vault, a little bit of smear was brown.   LABORATORY DATA: Pertinent for a very elevated platelet count, very elevated CPK, somewhat elevated SGOT. Pro time 14.8 with an of INR 1.1. BUN of 70, creatinine 5.6, potassium 6.4. Troponin 0.04 with a very elevated CPK of 5475. EKG showed sinus tach. Flat plate of the abdomen showed mild prominence of small bowel suggesting an ileus. No significant dilatation of the colon from stool. Chest x-ray showed no infiltrates. CAT scan of the head showed some atrophy. Arterial blood gas shows an elevated pCO2 of 49 with a pH 7.29.   The patient is awake and answers questions appropriately but she is somewhat lethargic.   ASSESSMENT AND PLAN:  1.  Probable renal failure from poor oral intake. Recommend encourage oral intake and intravenous rehydration.  2.  Constipation from pain medication. No evidence of any colonic blockage at this time and a negative physical exam. I would recommend stool softeners with senna because of her need for taking pain medication. She was prescribed this, but husband said that she did not like to take it because of diarrhea that might come. He said that she does not drink a lot of water either.  3.  She also  has a history of seizure disorders. She had a serious problem with Keppra that caused her to have very nervous agitation. He did say that the seizure disorder they were told that it was likely alcohol withdrawal seizures in the past. She has been out of rehab since 03/2012.   At this time no specific GI procedures or interventions recommended. I will follow with you.     ____________________________ Scot Junobert T. Elliott, MD rte:at D: 11/12/2014 15:28:25 ET T: 11/12/2014 16:08:10 ET JOB#: [id number]  cc: Scot Junobert T. Elliott, MD, <Dictator> Duane LopeJeffrey D. Judithann SheenSparks, MD Scot JunOBERT T  ELLIOTT MD ELECTRONICALLY SIGNED 12/10/2014 16:00

## 2014-12-25 NOTE — Consult Note (Signed)
Pt was quite delerious yesterday, I forgot to write a note after seeing her.  She is more oriented and knows she is in [facility].  Drinking water.  She complains of a headache and therefore does not want to talk any more.  I was consulted for constipation and abd pain and will sign off, reconsult if needed.  Electronic Signatures: Scot JunElliott, Naiyah Klostermann T (MD)  (Signed on 21-Mar-16 17:36)  Authored  Last Updated: 21-Mar-16 17:36 by Scot JunElliott, Leveda Kendrix T (MD)

## 2014-12-25 NOTE — Discharge Summary (Signed)
PATIENT NAME:  Sherry Lawrence, Sherry Lawrence MR#:  [id number] DATE OF BIRTH:  October 07, 1950  DATE OF ADMISSION:  11/12/2014 DATE OF DISCHARGE:  The patient is medically ready once bed available at rehabilitation.   PRESENTING COMPLAINT: Altered mental status, confusion.   DISCHARGE DIAGNOSES:  1.  Acute encephalopathy/altered mental status suspected due to severe dehydration and rhabdomyolysis, improving.  2.  Acute renal failure, appears prerenal azotemia secondary to poor p.o. intake and dehydration, improved.  3.  Hyperkalemia, resolved.  4.  High anion gap acidosis, mixed respiratory with metabolic acid, resolved.  5.  History of alcohol abuse in the past.  6.  History of smoking in the past.  7.  Generalized weakness with unsteady gait.  8.  Hyperlipidemia.  9.  Hypertension.  10.  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   CONDITION ON DISCHARGE: Fair.   CODE STATUS: Full code.    LABORATORY DATA: Creatinine is 1.44, sodium is 139, potassium 3.3. CK total is 2088. Uric acid is 13.4. Creatinine on admission was 5.52.   CONSULTATION: Nephrology, Dr. Cherylann Ratel.   MEDICATIONS AT DISCHARGE:  1.  Atorvastatin 40 mg daily.  2.  Budesonide/formoterol 160/4.5 two puffs b.i.d.  3.  Clonazepam 0.5 mg 1 tablet daily at bedtime as needed.  4.  Albuterol ipratropium 3 mL by nebulizer 4 times a day as needed.  5.  Lisinopril 20 mg daily.  6.  Tylenol 325 one tablet t.i.d.  7.  ProAir HFA 90 mcg inhalation 1 puff 4 times a day as needed.  8.  Famotidine 20 mg 1 tablet daily.  9.  Folic acid 1 tablet p.o. daily.  10.  Morphine extended release 15 mg b.i.d.  11.  Nabumetone 500 mg 1 tablet b.i.d. as needed for neck pain.  12.  Nicotine patch 14 mg daily.  13.  Docusate senna 1 tablet daily.  14.  Thiamine 100 mg daily.  15.  Multivitamin with minerals p.o. daily.  16.  Spiriva 18 mcg inhalation daily.  17.  Venlafaxine 75 mg 3 capsules p.o. daily.  18.  Tramadol 50 mg t.i.d. as needed.  19.  Mag-Ox 400 mg  b.i.d.  20.  Amlodipine 5 mg p.o. daily at bedtime.   SUMMARY OF HOSPITAL COURSE: Sherry Lawrence is a 64 year old Caucasian female with history of alcohol abuse and smoking along with history of hypertension, hyperlipidemia, comes to the Emergency Room brought in by her husband with altered mental status. She was admitted with:  1.  Altered mental status/acute encephalopathy, which was suspected due to severe dehydration/renal failure upon admission. She came in with creatinine of 5.52; her baseline creatinine is around 1.0. The patient was admitted, started on aggressive IV fluids, received IV bicarbonate drip as well. Her symptoms improved and mentation improved.  2.  Acute renal failure suspected dehydration from poor p.o. intake in the setting of p.o. narcotics, which were given, along with increased use of p.o. narcotics status post recent neck surgery. The patient aggressively received IV fluids. Nephrology consultation was done. The patient also received an IV bicarbonate drip. She was found to have rhabdomyolysis, which improved.  3.  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 The patient will follow up with primary care physician as outpatient. She received aggressive IV hydration.  4.  Acute rhabdomyolysis suspected to the muscle injury, laying around, not being active after neck surgery in the setting of p.o. pain medication intake. The patient received aggressive IV fluids and her CK total came down to 2000.  5.  Hypertension. Resumed home medications, which are amlodipine and lisinopril. Hydrochlorothiazide/triamterene was discontinued.  6.  History of alcohol abuse. The patient was kept on CIWA protocol. Did not seem to be in withdrawal.  7.  History of anxiety. The patient did seem "sometimes off;" however, she did answer most basic questions appropriately.  8.  Generalized weakness, unsteady gait. PT evaluation noted. Recommends rehabilitation. Awaiting, at present, insurance authorization.  The patient is medically stable, near baseline for discharge to rehab.   TIME SPENT: 40 minutes.     ____________________________ Wylie HailSona A. Allena KatzPatel, MD sap:bm D: 11/16/2014 13:05:21 ET T: 11/17/2014 01:48:40 ET JOB#: [id number]  cc: Kaydense Rizo A. Allena KatzPatel, MD, <Dictator> Willow OraSONA A Philmore Lepore MD ELECTRONICALLY SIGNED 11/28/2014 12:23

## 2014-12-25 NOTE — H&P (Signed)
PATIENT NAME:  Sherry Lawrence, Sherry Lawrence MR#:  [id number] DATE OF BIRTH:  September 25, 1950  DATE OF ADMISSION:  11/12/2014  REFERRING PHYSICIAN:  Sheryl L. Mindi Junker, MD.   FAMILY PHYSICIAN:  Nonlocal.    REASON FOR ADMISSION: Altered mental status.   HISTORY OF PRESENT ILLNESS: The patient is a 64 year old female with a significant history of COPD, anxiety/depression, hypertension and hyperlipidemia. Also has a remote history of seizures. Had cervical spine surgery done at [facility] approximately 2 weeks ago. Presents to the Emergency Room with acute confusion associated with abdominal pain and constipation. Has been taking oral narcotics for her postoperative pain. In the Emergency Room, no family is present and the patient is confused, unable to give any history.   PAST MEDICAL HISTORY:  1. Status post recent C-spine fusion.  2. Remote history of seizure disorder.  3. Hyperlipidemia.  4. Benign hypertension.  5. COPD.   6. Anxiety/depression.   MEDICATIONS:  1. Effexor XR 150 mg p.o. daily.  2. Thiamine 100 mg p.o. daily.  3. Lisinopril 20 mg p.o. daily.  4. Folic acid 1 mg p.o. daily.  5. Klonopin 0.25 mg p.o. at bedtime p.r.n.  6. Symbicort 160/4.5, two puffs b.i.d.  7. Lipitor 40 mg p.o. daily.  8. DuoNeb SVNs q.i.d.   ALLERGIES: No known drug allergies.   SOCIAL HISTORY: The patient smokes every day. There is some questionable history of alcohol use, but this is unsure.   FAMILY HISTORY:  Unable to obtain due to the patient's confusion.   REVIEW OF SYSTEMS:  Unable to obtain due to the patient's confusion.   PHYSICAL EXAMINATION: The patient is confused, but in no acute distress.  VITAL SIGNS: Currently remarkable for a blood pressure of 113/58 with a heart rate of 108, respiratory rate of 20, temperature of 98.4, saturation 90% on room air.  HEENT: Normocephalic, atraumatic. Pupils equal, round, reactive to light and accommodation. Extraocular movements are intact. Sclerae are anicteric.  Conjunctivae are clear. Oropharynx is clear.   NECK: Supple, without JVD. No adenopathy or thyromegaly is noted.  LUNGS: Revealed basilar crackles without wheezes or rhonchi. No dullness. Respiratory effort is normal.  CARDIAC: Rapid rate with a regular rhythm. Normal S1 and S2. There is a 2/6 systolic murmur noted throughout the precordium.  No rubs or gallops are present. PMI is nondisplaced. Chest wall is nontender.  ABDOMEN: Distended, diffusely tender, with hypoactive bowel sounds. No obvious organomegaly or ascites noted.  EXTREMITIES: Without clubbing, cyanosis or edema. Pulses were 2+ bilaterally.  SKIN: Warm and dry without rash or lesions.  NEUROLOGIC: Revealed cranial nerves II through XII grossly intact. Deep tendon reflexes were symmetric. Motor and sensory exam is nonfocal.  PSYCHIATRIC: Revealed a patient who was alert, but disoriented to place and time.  She was oriented to person.   LABORATORY DATA: Glucose was 108 with a BUN of 70 and a creatinine of 5.61, with a GFR of 7. Her potassium was 6.4 with a sodium of 131. Her troponin was 0.04. Her white count was 13.5 with a hemoglobin of 9.1 and a platelet count of 702,000. Her urinalysis was unremarkable. Her EKG revealed sinus tachycardia with no acute ischemic changes.   Chest x-ray was unremarkable. KUB revealed mild prominence of the small bowel but no obvious obstruction. Head CT was negative.   ASSESSMENT:  1. Altered mental status due to metabolic encephalopathy.  2. Acute renal failure.  3. Hyperkalemia.  4. Chronic obstructive pulmonary disease.  5. Abdominal pain.  6. Constipation.  PLAN: The patient will be admitted to the floor as a full code. We will consult nephrology and GI. We will obtain an abdominal ultrasound, then proceed with clear liquid diet along with her IV fluids. We will recheck her potassium later tonight after treatment given in the Emergency Room. Minimize narcotics. Optimize her bowel regimen.  Further treatment and evaluation will depend upon the patient's progress.   TOTAL TIME SPENT ON THIS PATIENT: 50 minutes.  ____________________________ Duane LopeJeffrey D. Judithann SheenSparks, MD jds:by D: 11/12/2014 13:31:33 ET T: 11/12/2014 13:39:46 ET JOB#: [id number]  cc: Duane LopeJeffrey D. Judithann SheenSparks, MD, <Dictator> Vinayak Bobier Rodena Medin Jessamy Torosyan MD ELECTRONICALLY SIGNED 11/12/2014 15:00

## 2014-12-25 NOTE — Discharge Summary (Signed)
PATIENT NAME:  Staci RighterMCADAMS, Anamae S MR#:  [id number] DATE OF BIRTH:  1951-01-06  DATE OF ADMISSION:  11/12/2014 DATE OF DISCHARGE:  11/17/2014  ADDENDUM  Ms. Thurman CoyerMcAdams has a bed offer at [facility]. She will be discharged later today. We will try to wean her oxygen to room air. Her sats are 96 to 98% on 1 liter. She will use oxygen as needed, 1 to 2 liters nasal cannula per minute, if her sats drop less than 92%.   ____________________________ Wylie HailSona A. Allena KatzPatel, MD sap:sb D: 11/17/2014 09:13:47 ET T: 11/17/2014 14:09:15 ET JOB#: [id number]  cc: Ovie Eastep A. Allena KatzPatel, MD, <Dictator> Willow OraSONA A Hasan Douse MD ELECTRONICALLY SIGNED 11/28/2014 12:23

## 2015-03-29 LAB — MAGNESIUM: Magnesium: 1.7 mg/dL

## 2015-03-29 LAB — POTASSIUM: Potassium: 3 mmol/L — ABNORMAL LOW

## 2015-05-10 IMAGING — CT CT HEAD WITHOUT CONTRAST
4 series · 18 of 30 positions shown, 19 images · non-contrast
Comparison: None.

CLINICAL DATA: Altered mental status.  Confusion.  Seizure.

EXAM:
CT HEAD WITHOUT CONTRAST
TECHNIQUE: Contiguous axial images were obtained from the base of the skull
through the vertex without intravenous contrast.

[Series 2: head bone · axial · 0.38mm/px · z∈[-162,-32]mm · 8 of 81 slices shown]
[im 8/81  bone]
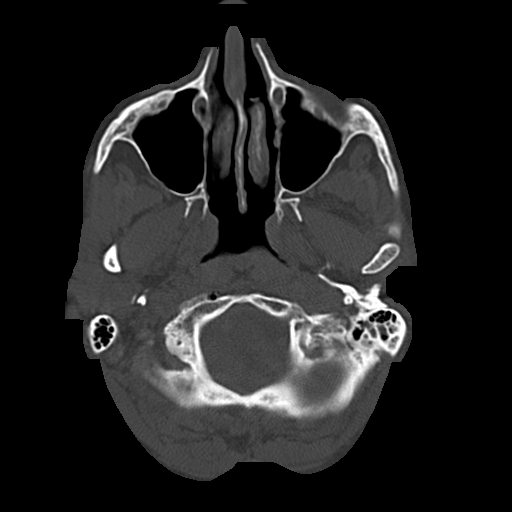
[im 15/81  bone]
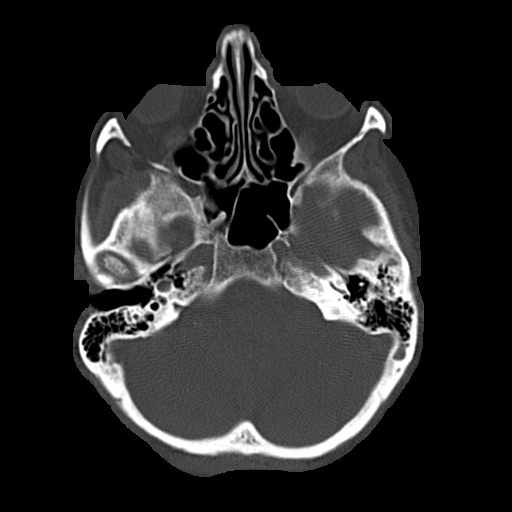
[im 30/81  bone]
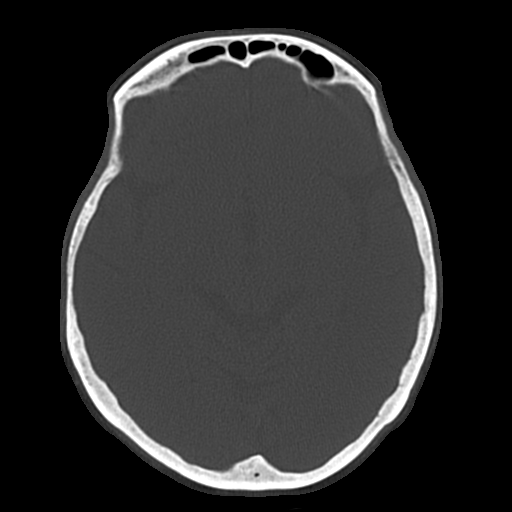
[im 37/81  bone]
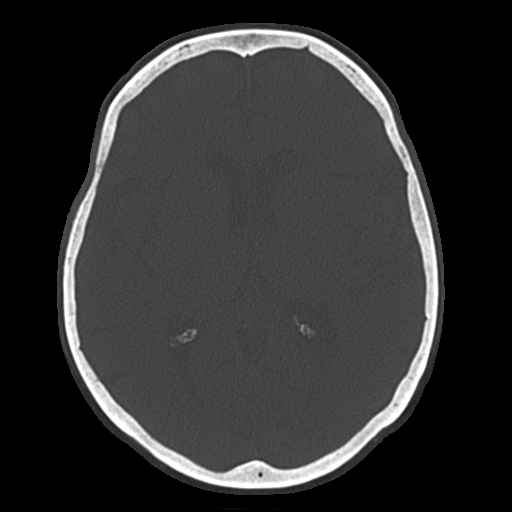
[im 44/81  bone]
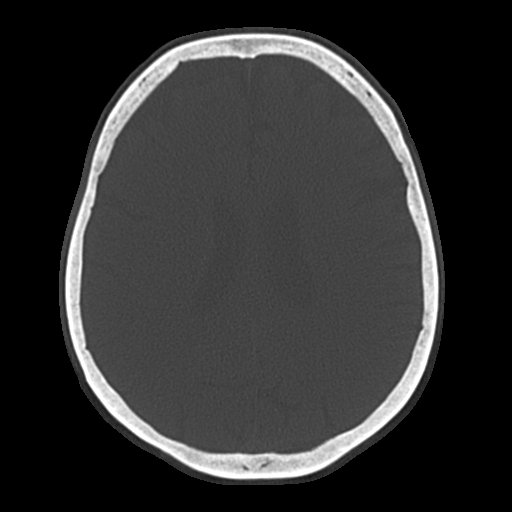
[im 51/81  bone]
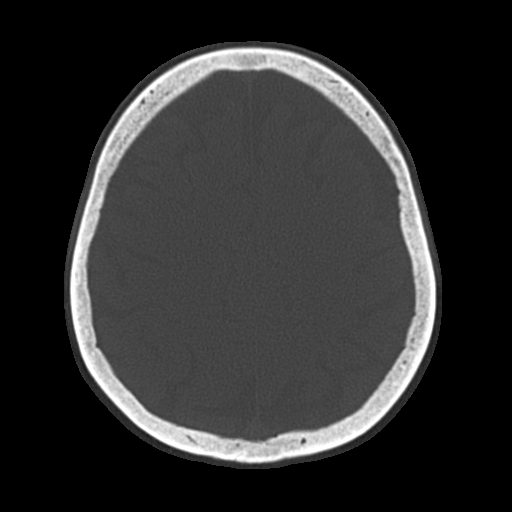
[im 66/81  bone]
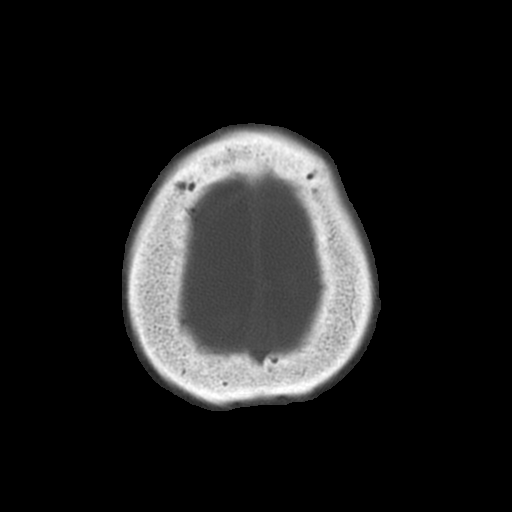
[im 73/81  bone]
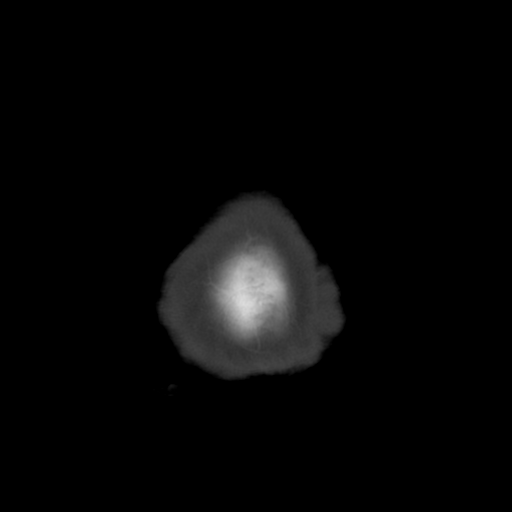

[Series 3: head wo · axial · 0.38mm/px · z∈[-122,-76]mm · 2 of 29 slices shown, 3 images]
[im 10/29  brain]
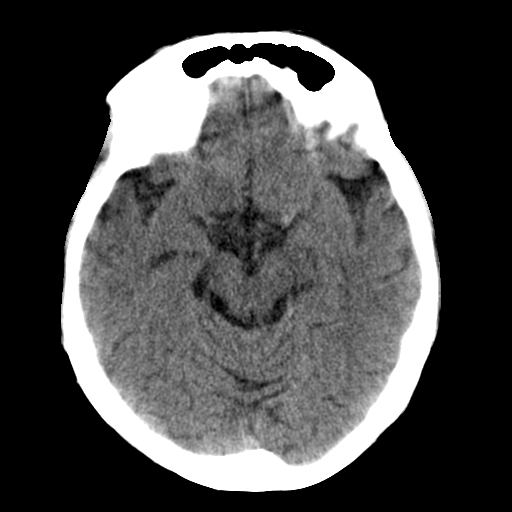
[im 10/29  bone]
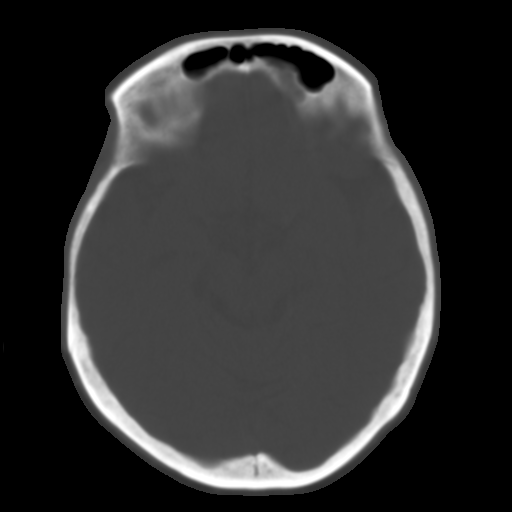
[im 19/29  brain]
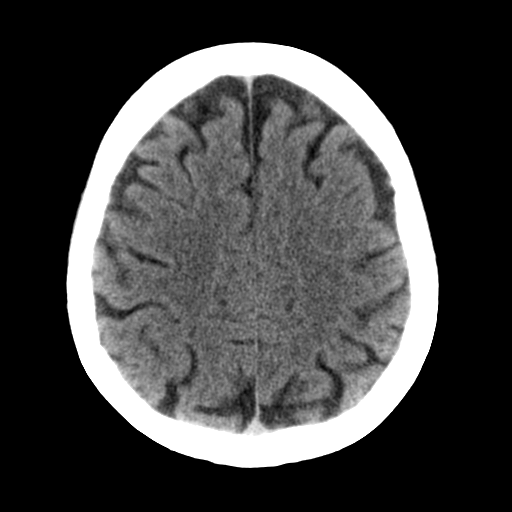

[Series 4: head wo recons · axial · 0.39mm/px · z∈[-71,-30]mm · 2 of 27 slices shown]
[im 9/27  brain]
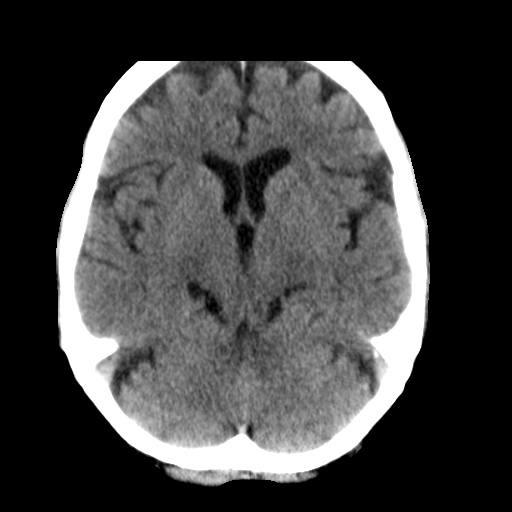
[im 18/27  brain]
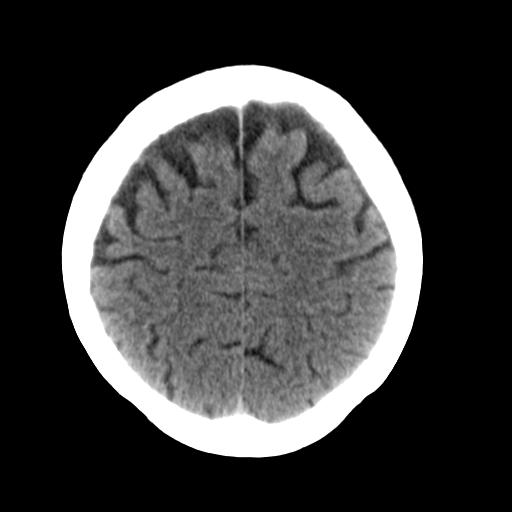

[Series 5: head bone recons · axial · 0.38mm/px · z∈[-101,-29]mm · 6 of 71 slices shown]
[im 8/71  bone]
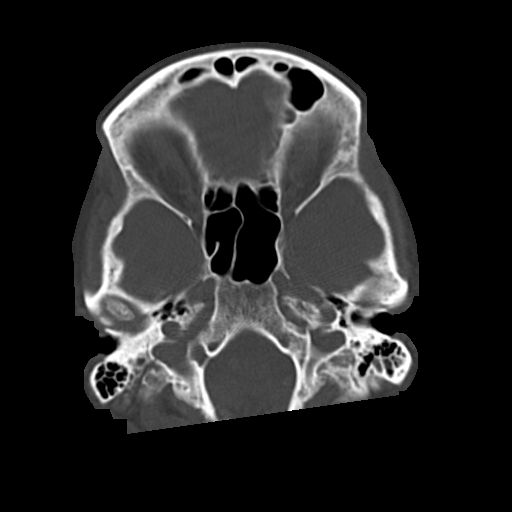
[im 16/71  bone]
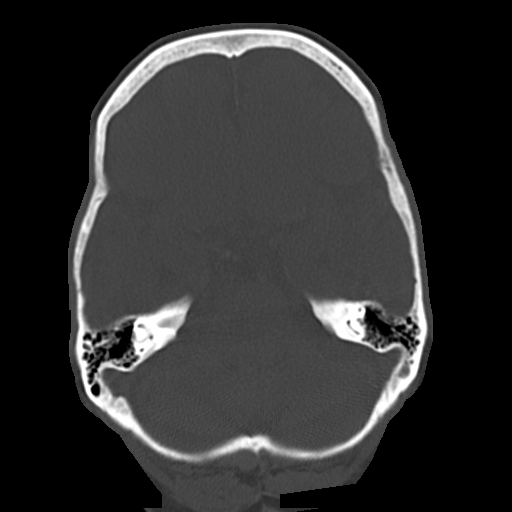
[im 24/71  bone]
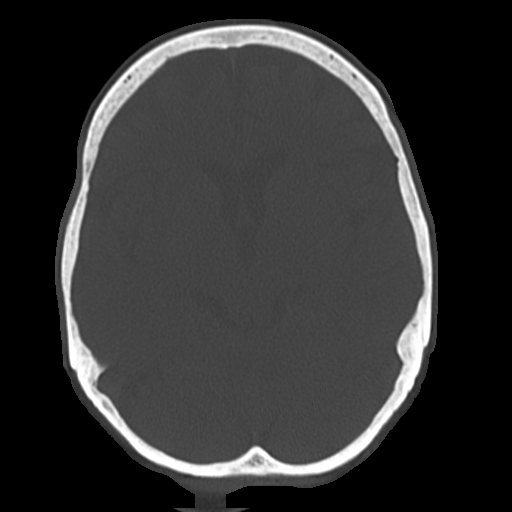
[im 32/71  bone]
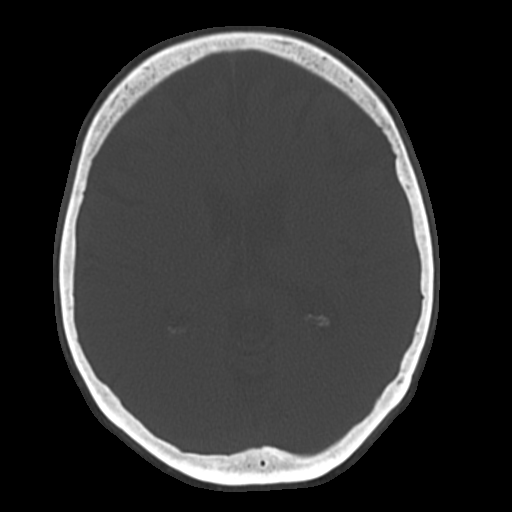
[im 39/71  bone]
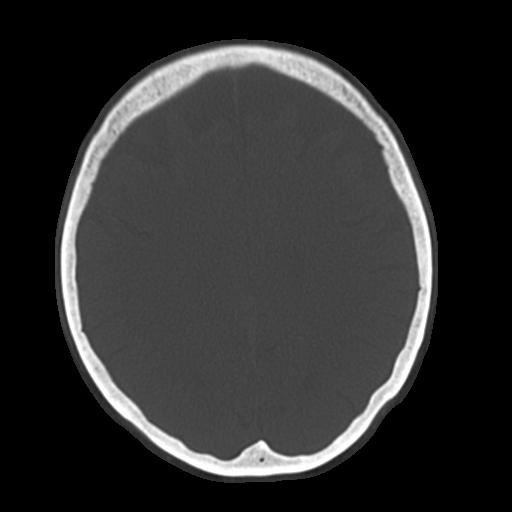
[im 47/71  bone]
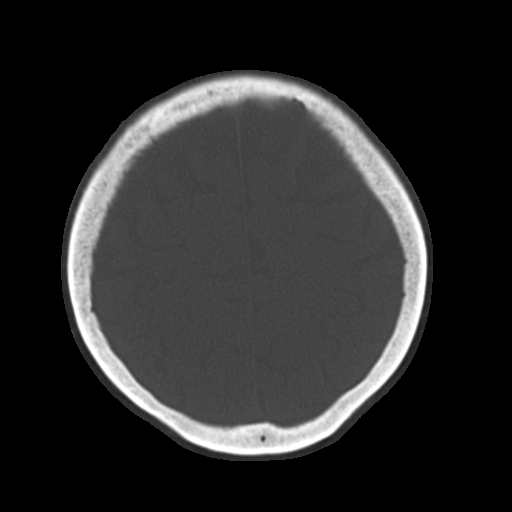

[18 of 30 positions shown; findings below may reference images not displayed]

FINDINGS: There is no evidence of acute infarction, mass lesion, or intra- or
extra-axial hemorrhage on CT.

Prominence of the sulci suggests mild cortical volume loss. Minimal
periventricular white matter change likely reflects small vessel
ischemic microangiopathy.

The posterior fossa, including the cerebellum, brainstem and fourth
ventricle, is within normal limits. The third and lateral
ventricles, and basal ganglia are unremarkable in appearance. The
cerebral hemispheres are symmetric in appearance, with normal
gray-white differentiation. No mass effect or midline shift is seen.

There is no evidence of fracture; visualized osseous structures are
unremarkable in appearance. The orbits are within normal limits. The
paranasal sinuses and mastoid air cells are well-aerated. No
significant soft tissue abnormalities are seen.
IMPRESSION: 1. No acute intracranial pathology seen on CT.
2. Mild cortical volume loss and minimal small vessel ischemic
microangiopathy noted.
